# Patient Record
Sex: Female | Born: 1980 | Race: Black or African American | Hispanic: No | Marital: Single | Smoking: Current some day smoker
Health system: Southern US, Community
[De-identification: ages and names within clinical notes are randomized; demographics above are authoritative.]

## PROBLEM LIST (undated history)

## (undated) HISTORY — PX: NOSE SURGERY: SHX723

---

## 2002-10-18 ENCOUNTER — Emergency Department (HOSPITAL_COMMUNITY): Admission: EM | Admit: 2002-10-18 | Discharge: 2002-10-18 | Payer: Self-pay | Admitting: Emergency Medicine

## 2003-02-20 ENCOUNTER — Ambulatory Visit (HOSPITAL_COMMUNITY): Admission: RE | Admit: 2003-02-20 | Discharge: 2003-02-20 | Payer: Self-pay | Admitting: Family Medicine

## 2003-02-20 ENCOUNTER — Encounter: Payer: Self-pay | Admitting: Family Medicine

## 2004-07-18 ENCOUNTER — Emergency Department (HOSPITAL_COMMUNITY): Admission: AC | Admit: 2004-07-18 | Discharge: 2004-07-18 | Payer: Self-pay

## 2005-06-17 ENCOUNTER — Inpatient Hospital Stay (HOSPITAL_COMMUNITY): Admission: AD | Admit: 2005-06-17 | Discharge: 2005-06-20 | Payer: Self-pay | Admitting: Obstetrics and Gynecology

## 2010-09-15 ENCOUNTER — Encounter: Payer: Self-pay | Admitting: Obstetrics and Gynecology

## 2017-05-27 ENCOUNTER — Ambulatory Visit (INDEPENDENT_AMBULATORY_CARE_PROVIDER_SITE_OTHER): Payer: No Typology Code available for payment source

## 2017-05-27 ENCOUNTER — Ambulatory Visit
Admission: EM | Admit: 2017-05-27 | Discharge: 2017-05-27 | Disposition: A | Discharge status: Home / Self Care | Payer: No Typology Code available for payment source | Attending: Family Medicine | Admitting: Family Medicine

## 2017-05-27 ENCOUNTER — Encounter: Payer: Self-pay | Admitting: *Deleted

## 2017-05-27 DIAGNOSIS — M5431 Sciatica, right side: Secondary | ICD-10-CM

## 2017-05-27 LAB — URINALYSIS, COMPLETE (UACMP) WITH MICROSCOPIC
Bacteria, UA: NONE SEEN
Bilirubin Urine: NEGATIVE
Glucose, UA: NEGATIVE mg/dL
Hgb urine dipstick: NEGATIVE
Ketones, ur: NEGATIVE mg/dL
Leukocytes, UA: NEGATIVE
Nitrite: NEGATIVE
Protein, ur: NEGATIVE mg/dL
Specific Gravity, Urine: 1.01 (ref 1.005–1.030)
pH: 5.5 (ref 5.0–8.0)

## 2017-05-27 LAB — COMPREHENSIVE METABOLIC PANEL
ALT: 16 U/L (ref 14–54)
AST: 22 U/L (ref 15–41)
Albumin: 4.6 g/dL (ref 3.5–5.0)
Alkaline Phosphatase: 56 U/L (ref 38–126)
Anion gap: 11 (ref 5–15)
BUN: 11 mg/dL (ref 6–20)
CO2: 22 mmol/L (ref 22–32)
Calcium: 9.3 mg/dL (ref 8.9–10.3)
Chloride: 102 mmol/L (ref 101–111)
Creatinine, Ser: 0.66 mg/dL (ref 0.44–1.00)
GFR calc Af Amer: >60 mL/min (ref >60)
GFR calc non Af Amer: >60 mL/min (ref >60)
Glucose, Bld: 85 mg/dL (ref 65–99)
Potassium: 3.7 mmol/L (ref 3.5–5.1)
Sodium: 135 mmol/L (ref 135–145)
Total Bilirubin: 0.4 mg/dL (ref 0.3–1.2)
Total Protein: 8.5 g/dL — ABNORMAL HIGH (ref 6.5–8.1)

## 2017-05-27 LAB — CBC WITH DIFFERENTIAL/PLATELET
Basophils Absolute: 0.1 10*3/uL (ref 0–0.1)
Basophils Relative: 1 %
Eosinophils Absolute: 0 10*3/uL (ref 0–0.7)
Eosinophils Relative: 1 %
HCT: 38.4 % (ref 35.0–47.0)
Hemoglobin: 13.4 g/dL (ref 12.0–16.0)
Lymphocytes Relative: 40 %
Lymphs Abs: 3.1 10*3/uL (ref 1.0–3.6)
MCH: 33.6 pg (ref 26.0–34.0)
MCHC: 34.9 g/dL (ref 32.0–36.0)
MCV: 96.1 fL (ref 80.0–100.0)
Monocytes Absolute: 0.6 10*3/uL (ref 0.2–0.9)
Monocytes Relative: 7 %
Neutro Abs: 3.9 10*3/uL (ref 1.4–6.5)
Neutrophils Relative %: 51 %
Platelets: 285 10*3/uL (ref 150–440)
RBC: 3.99 MIL/uL (ref 3.80–5.20)
RDW: 13 % (ref 11.5–14.5)
WBC: 7.7 10*3/uL (ref 3.6–11.0)

## 2017-05-27 LAB — PREGNANCY, URINE: Preg Test, Ur: NEGATIVE

## 2017-05-27 MED ORDER — KETOROLAC TROMETHAMINE 10 MG PO TABS: 10.0000 mg | ORAL_TABLET | Freq: Three times a day (TID) | ORAL | 0 refills | Status: DC | PRN

## 2017-05-27 MED ORDER — ONDANSETRON 8 MG PO TBDP
8.0000 mg | ORAL_TABLET | Freq: Once | ORAL | Status: AC
  Administered 2017-05-27: 8 mg via ORAL

## 2017-05-27 MED ORDER — CYCLOBENZAPRINE HCL 10 MG PO TABS: 10.0000 mg | ORAL_TABLET | Freq: Three times a day (TID) | ORAL | 0 refills | Status: DC | PRN

## 2017-05-27 MED ORDER — KETOROLAC TROMETHAMINE 60 MG/2ML IM SOLN
60.0000 mg | Freq: Once | INTRAMUSCULAR | Status: AC
  Administered 2017-05-27: 60 mg via INTRAMUSCULAR

## 2017-05-27 MED ORDER — ONDANSETRON 8 MG PO TBDP: 8.0000 mg | ORAL_TABLET | Freq: Three times a day (TID) | ORAL | 0 refills | Status: DC | PRN

## 2017-05-27 NOTE — ED Triage Notes (Signed)
Pt started having  Lower back pain two weeks ago. She is now having pain to right leg and left side abd.

## 2017-05-27 NOTE — ED Provider Notes (Signed)
[facility]    CSN: [id number] Arrival date & time: 05/27/17  4098     History   Chief Complaint Chief Complaint  Patient presents with  . Back Pain    HPI Shelly Jones is a 36 y.o. female.    Back Pain  Location:  Lumbar spine Quality:  Aching Radiates to:  R posterior upper leg Pain severity:  Moderate Pain is:  Same all the time Onset quality:  Sudden Duration:  2 weeks Timing:  Constant Progression:  Unchanged Chronicity:  New Context: not emotional stress, not falling, not jumping from heights, not lifting heavy objects, not MCA, not MVA, not occupational injury, not pedestrian accident, not physical stress, not recent illness, not recent injury and not twisting   Relieved by:  Nothing Worsened by:  Twisting and movement Ineffective treatments:  OTC medications Associated symptoms: abdominal pain (left flank)   Associated symptoms: no abdominal swelling, no bladder incontinence, no bowel incontinence, no chest pain, no dysuria, no fever, no headaches, no leg pain, no numbness, no paresthesias, no pelvic pain, no perianal numbness, no tingling, no weakness and no weight loss   Risk factors: no hx of cancer, no hx of osteoporosis, no menopause, no recent surgery, no steroid use and no vascular disease     History reviewed. No pertinent past medical history.  There are no active problems to display for this patient.   Past Surgical History:  Procedure Laterality Date  . NOSE SURGERY      OB History    Gravida Para Term Preterm AB Living   1             SAB TAB Ectopic Multiple Live Births                   Home Medications    Prior to Admission medications   Medication Sig Start Date End Date Taking? Authorizing Provider  cyclobenzaprine (FLEXERIL) 10 MG tablet Take 1 tablet (10 mg total) by mouth 3 (three) times daily as needed for muscle spasms. 05/27/17   Payton Mccallum, MD  ketorolac (TORADOL) 10 MG tablet Take 1 tablet (10 mg total) by  mouth every 8 (eight) hours as needed. 05/27/17   Payton Mccallum, MD  ondansetron (ZOFRAN ODT) 8 MG disintegrating tablet Take 1 tablet (8 mg total) by mouth every 8 (eight) hours as needed. 05/27/17   Payton Mccallum, MD    Family History History reviewed. No pertinent family history.  Social History Social History  Substance Use Topics  . Smoking status: Current Some Day Smoker    Packs/day: 0.50    Years: 5.00  . Smokeless tobacco: Never Used  . Alcohol use Yes     Comment: occasionally      Allergies   Kiwi extract; Percocet [oxycodone-acetaminophen]; Chocolate; Penicillins; and Pineapple   Review of Systems Review of Systems  Constitutional: Negative for fever and weight loss.  Cardiovascular: Negative for chest pain.  Gastrointestinal: Positive for abdominal pain (left flank). Negative for bowel incontinence.  Genitourinary: Negative for bladder incontinence, dysuria and pelvic pain.  Musculoskeletal: Positive for back pain.  Neurological: Negative for tingling, weakness, numbness, headaches and paresthesias.     Physical Exam Triage Vital Signs ED Triage Vitals  Enc Vitals Group     BP 05/27/17 1000 (!) 131/93     Pulse Rate 05/27/17 1000 97     Resp 05/27/17 1000 12     Temp 05/27/17 1000 98.5 F (36.9 C)  Temp Source 05/27/17 1000 Oral     SpO2 05/27/17 1000 100 %     Weight 05/27/17 0952 182 lb (82.6 kg)     Height 05/27/17 0952  (1.626 m)     Head Circumference --      Peak Flow --      Pain Score 05/27/17 0953 7     Pain Loc --      Pain Edu? --      Excl. in GC? --    No data found.   Updated Vital Signs BP (!) 131/93 (BP Location: Left Arm)   Pulse 97   Temp 98.5 F (36.9 C) (Oral)   Resp 12   Ht  (1.626 m)   Wt 182 lb (82.6 kg)   LMP 05/26/2017 (Exact Date) Comment: denies preg and signed waiver  SpO2 100%   BMI 31.24 kg/m   Visual Acuity Right Eye Distance:   Left Eye Distance:   Bilateral Distance:    Right Eye  Near:   Left Eye Near:    Bilateral Near:     Physical Exam  Constitutional: She appears well-developed and well-nourished. No distress.  Cardiovascular: Normal rate and regular rhythm.   Pulmonary/Chest: Effort normal and breath sounds normal.  Abdominal: Soft. Bowel sounds are normal. She exhibits no distension and no mass. There is no tenderness. There is no rebound and no guarding.  Musculoskeletal:       Lumbar back: She exhibits tenderness and spasm. She exhibits normal range of motion, no bony tenderness, no swelling, no edema, no deformity, no laceration, no pain and normal pulse.  Skin: She is not diaphoretic.  Nursing note and vitals reviewed.    UC Treatments / Results  Labs (all labs ordered are listed, but only abnormal results are displayed) Labs Reviewed  COMPREHENSIVE METABOLIC PANEL - Abnormal; Notable for the following:       Result Value   Total Protein 8.5 (*)    All other components within normal limits  URINALYSIS, COMPLETE (UACMP) WITH MICROSCOPIC - Abnormal; Notable for the following:    Color, Urine STRAW (*)    Squamous Epithelial / LPF 0-5 (*)    All other components within normal limits  CBC WITH DIFFERENTIAL/PLATELET  PREGNANCY, URINE    EKG  EKG Interpretation None       Radiology Dg Abd 2 Views  Result Date: 05/27/2017 CLINICAL DATA:  Abdominal pain for the past 2 weeks. Patient reports walking without a limp. Some loose stools. History of kidney stones. EXAM: ABDOMEN - 2 VIEW COMPARISON:  None in PACs FINDINGS: There is a moderate amount of gas and stool within the colon. The stool burden is not excessive however. Small amounts of small bowel gas are present without evidence of abnormal distention. There is no free extraluminal gas. There is gas in the rectum. An IUD is present in the pelvis. No calcified urinary tract stones are observed. The bony structures are unremarkable. IMPRESSION: No definite acute intra-abdominal abnormality. If  enteritis or colitis are suspected clinically, abdominal and pelvic CT scanning would be a useful next imaging step. Colitis is suspected clinically, No calcified urinary tract stones are observed. Electronically Signed   By: David  [place] M.D.   On: 05/27/2017 10:54    Procedures Procedures (including critical care time)  Medications Ordered in UC Medications  ketorolac (TORADOL) injection 60 mg (60 mg Intramuscular Given 05/27/17 1025)  ondansetron (ZOFRAN-ODT) disintegrating tablet 8 mg (8 mg Oral Given 05/27/17  1025)     Initial Impression / Assessment and Plan / UC Course  I have reviewed the triage vital signs and the nursing notes.  Pertinent labs & imaging results that were available during my care of the patient were reviewed by me and considered in my medical decision making (see chart for details).       Final Clinical Impressions(s) / UC Diagnoses   Final diagnoses:  Sciatica, right side    New Prescriptions Discharge Medication List as of 05/27/2017 11:50 AM    START taking these medications   Details  cyclobenzaprine (FLEXERIL) 10 MG tablet Take 1 tablet (10 mg total) by mouth 3 (three) times daily as needed for muscle spasms., Starting Wed 05/27/2017, Normal    ketorolac (TORADOL) 10 MG tablet Take 1 tablet (10 mg total) by mouth every 8 (eight) hours as needed., Starting Wed 05/27/2017, Normal    ondansetron (ZOFRAN ODT) 8 MG disintegrating tablet Take 1 tablet (8 mg total) by mouth every 8 (eight) hours as needed., Starting Wed 05/27/2017, Normal       1. Labs/x-ray results and diagnosis reviewed with patient; patient given toradol  IM x1 and zofran  po x 1 with improvement of symptoms 2. rx as per orders above; reviewed possible side effects, interactions, risks and benefits  3. Recommend supportive treatment with rest, ice, otc analgesics prn 4. Follow-up prn if symptoms worsen or don't improve Controlled Substance Prescriptions [place] Controlled  Substance Registry consulted? No   Payton Mccallum, MD 05/27/17 1218

## 2018-10-08 ENCOUNTER — Emergency Department
Admission: EM | Admit: 2018-10-08 | Discharge: 2018-10-08 | Disposition: A | Discharge status: Home / Self Care | Payer: PRIVATE HEALTH INSURANCE | Attending: Emergency Medicine | Admitting: Emergency Medicine

## 2018-10-08 ENCOUNTER — Emergency Department: Payer: PRIVATE HEALTH INSURANCE

## 2018-10-08 ENCOUNTER — Encounter: Payer: Self-pay | Admitting: Emergency Medicine

## 2018-10-08 DIAGNOSIS — Y92009 Unspecified place in unspecified non-institutional (private) residence as the place of occurrence of the external cause: Secondary | ICD-10-CM | POA: Insufficient documentation

## 2018-10-08 DIAGNOSIS — Y998 Other external cause status: Secondary | ICD-10-CM | POA: Diagnosis not present

## 2018-10-08 DIAGNOSIS — F17210 Nicotine dependence, cigarettes, uncomplicated: Secondary | ICD-10-CM | POA: Insufficient documentation

## 2018-10-08 DIAGNOSIS — S0990XA Unspecified injury of head, initial encounter: Secondary | ICD-10-CM | POA: Diagnosis not present

## 2018-10-08 DIAGNOSIS — S0993XA Unspecified injury of face, initial encounter: Secondary | ICD-10-CM | POA: Insufficient documentation

## 2018-10-08 DIAGNOSIS — Y9389 Activity, other specified: Secondary | ICD-10-CM | POA: Diagnosis not present

## 2018-10-08 MED ORDER — MELOXICAM 15 MG PO TABS: 15.0000 mg | ORAL_TABLET | Freq: Every day | ORAL | 1 refills | Status: AC

## 2018-10-08 MED ORDER — METHOCARBAMOL 500 MG PO TABS: 500.0000 mg | ORAL_TABLET | Freq: Three times a day (TID) | ORAL | 0 refills | Status: AC | PRN
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
## 2020-11-20 ENCOUNTER — Ambulatory Visit
Admission: EM | Admit: 2020-11-20 | Discharge: 2020-11-20 | Disposition: A | Discharge status: Home / Self Care | Payer: PRIVATE HEALTH INSURANCE | Attending: Family Medicine | Admitting: Family Medicine

## 2020-11-20 ENCOUNTER — Ambulatory Visit
Admission: RE | Admit: 2020-11-20 | Discharge: 2020-11-20 | Disposition: A | Discharge status: Home / Self Care | Payer: No Typology Code available for payment source | Source: Ambulatory Visit | Attending: Family Medicine | Admitting: Family Medicine

## 2020-11-20 ENCOUNTER — Other Ambulatory Visit: Payer: Self-pay

## 2020-11-20 DIAGNOSIS — M7989 Other specified soft tissue disorders: Secondary | ICD-10-CM

## 2020-11-20 DIAGNOSIS — M79661 Pain in right lower leg: Secondary | ICD-10-CM | POA: Diagnosis not present

## 2020-11-20 NOTE — Discharge Instructions (Addendum)
Sending you for ultrasound of the leg.  I will call you when I get the results.

## 2020-11-20 NOTE — ED Triage Notes (Signed)
Pt reports RLE swelling and painful x 1.5 weeks.  Reports R calf cramping. No warmth or redness noted.  No SOB.  No FMH of clots. No recent travel.    States that approx 3 weeks ago did have severe lumbar/sciatica pain radiating BLE with numbness in bilateral feet.  Was eval'ed at [facility] and had xray/UA but never found anything.  Took Prednisone and it resolved after about one week. Was sedentary during this time period.

## 2020-11-21 NOTE — ED Provider Notes (Signed)
[name]    CSN: [id number] Arrival date & time: 11/20/20  1426      History   Chief Complaint Chief Complaint  Patient presents with  . Leg Pain    HPI Shelly Jones is a 40 y.o. female.   Pt is a 40 year old female that presents with RLE pain, swelling. This has been x 1.5 weeks. Reports right calf cramping. No warmth or redness noted.  No chest pain,  SOB.  No PMH of DVT or PE. No recent travel.  States that approx 3 weeks ago did have severe lumbar/sciatica pain radiating BLE with numbness in bilateral feet, mild swelling Finished course or prednisone and it resolved after about one week. Was sedentary during this time period.    Leg Pain   No past medical history on file.  There are no problems to display for this patient.   Past Surgical History:  Procedure Laterality Date  . NOSE SURGERY      OB History    Gravida  1   Para      Term      Preterm      AB      Living        SAB      IAB      Ectopic      Multiple      Live Births               Home Medications    Prior to Admission medications   Not on File    Family History No family history on file.  Social History Social History   Tobacco Use  . Smoking status: Current Some Day Smoker    Packs/day: 0.50    Years: 5.00    Pack years: 2.50  . Smokeless tobacco: Never Used  Substance Use Topics  . Alcohol use: Yes    Comment: occasionally   . Drug use: No     Allergies   Kiwi extract, Percocet [oxycodone-acetaminophen], Chocolate, Penicillins, and Pineapple   Review of Systems Review of Systems   Physical Exam Triage Vital Signs ED Triage Vitals  Enc Vitals Group     BP 11/20/20 1438 (!) 138/93     Pulse Rate 11/20/20 1438 (!) 108     Resp 11/20/20 1438 14     Temp 11/20/20 1438 98.8 F (37.1 C)     Temp Source 11/20/20 1438 Oral     SpO2 11/20/20 1438 93 %     Weight --      Height --      Head Circumference --      Peak Flow --       Pain Score 11/20/20 1452 6     Pain Loc --      Pain Edu? --      Excl. in [place]? --    No data found.  Updated Vital Signs BP (!) 138/93 (BP Location: Right Arm)   Pulse (!) 108   Temp 98.8 F (37.1 C) (Oral)   Resp 14   SpO2 93%   Visual Acuity Right Eye Distance:   Left Eye Distance:   Bilateral Distance:    Right Eye Near:   Left Eye Near:    Bilateral Near:     Physical Exam Vitals and nursing note reviewed.  Constitutional:      General: She is not in acute distress.    Appearance: Normal appearance. She is not ill-appearing, toxic-appearing or  diaphoretic.  HENT:     Head: Normocephalic.  Eyes:     Conjunctiva/sclera: Conjunctivae normal.  Cardiovascular:     Rate and Rhythm: Normal rate and regular rhythm.  Pulmonary:     Effort: Pulmonary effort is normal.     Breath sounds: Normal breath sounds.  Musculoskeletal:        General: Normal range of motion.     Cervical back: Normal range of motion.     Right lower leg: Edema present.     Left lower leg: Edema present.     Comments: BLE edema worse on the right. Non pitting. No erythema Pain in calf with palpation along with pain in lateral thigh area.  2+ pedal pulse. No foot swelling   Skin:    General: Skin is warm and dry.     Findings: No rash.  Neurological:     Mental Status: She is alert.  Psychiatric:        Mood and Affect: Mood normal.      UC Treatments / Results  Labs (all labs ordered are listed, but only abnormal results are displayed) Labs Reviewed - No data to display  EKG   Radiology US Venous Img Lower Unilateral Right (DVT)  Result Date: 11/20/2020 CLINICAL DATA:  Right lower extremity swelling. EXAM: RIGHT LOWER EXTREMITY VENOUS DOPPLER ULTRASOUND TECHNIQUE: Gray-scale sonography with compression, as well as color and duplex ultrasound, were performed to evaluate the deep venous system(s) from the level of the common femoral vein through the popliteal and proximal calf veins.  COMPARISON:  None. FINDINGS: VENOUS Normal compressibility of the common femoral, superficial femoral, and popliteal veins, as well as the visualized calf veins. Visualized portions of profunda femoral vein and great saphenous vein unremarkable. No filling defects to suggest DVT on grayscale or color Doppler imaging. Doppler waveforms show normal direction of venous flow, normal respiratory plasticity and response to augmentation. Limited views of the contralateral common femoral vein are unremarkable. IMPRESSION: No evidence of DVT. Electronically Signed   By: Feliberto Harts MD   On: 11/20/2020 17:17    Procedures Procedures (including critical care time)  Medications Ordered in UC Medications - No data to display  Initial Impression / Assessment and Plan / UC Course  I have reviewed the triage vital signs and the nursing notes.  Pertinent labs & imaging results that were available during my care of the patient were reviewed by me and considered in my medical decision making (see chart for details).     LE swelling, pain Concern for PE. Sending for [place] to r/o No chest pain, SOB or concern for PE.  Patient is mildly tachycardic but reporting history of similar for a while. Will discuss treatment plan after results Final Clinical Impressions(s) / UC Diagnoses   Final diagnoses:  Pain and swelling of right lower leg     Discharge Instructions     Sending you for ultrasound of the leg.  I will call you when I get the results.     ED Prescriptions    None     PDMP not reviewed this encounter.   Janace Aris, NP 11/21/20 [phone]

## 2021-09-13 ENCOUNTER — Other Ambulatory Visit: Payer: Self-pay

## 2021-09-13 ENCOUNTER — Encounter: Payer: Self-pay | Admitting: Emergency Medicine

## 2021-09-13 ENCOUNTER — Ambulatory Visit
Admission: EM | Admit: 2021-09-13 | Discharge: 2021-09-13 | Disposition: A | Discharge status: Home / Self Care | Payer: No Typology Code available for payment source | Attending: Internal Medicine | Admitting: Internal Medicine

## 2021-09-13 DIAGNOSIS — R5383 Other fatigue: Secondary | ICD-10-CM | POA: Diagnosis not present

## 2021-09-13 DIAGNOSIS — J069 Acute upper respiratory infection, unspecified: Secondary | ICD-10-CM

## 2021-09-13 NOTE — ED Triage Notes (Signed)
Pt here with 3 days of cough, body aches, low grade fevers, sore throat, burning in head, hair loss in the area of burning and headache. Pt tested negative for flu and covid at work and needs to be taken out of work by a provider.

## 2021-09-13 NOTE — ED Provider Notes (Signed)
[facility]    CSN: [id number] Arrival date & time: 09/13/21  1057      History   Chief Complaint Chief Complaint  Patient presents with   Cough   Sore Throat   Nasal Congestion   Headache   Alopecia    HPI Shelly Jones is a 41 y.o. female comes to urgent care with 3 to 4-day history of nonproductive cough, generalized body aches, fever of 100 F and sore throat.  Patient's symptoms started with a cough and generalized body aches 4 days ago.  Subsequently patient started experiencing sore throat.  Cough is not productive of sputum.  She denies any chest tightness or wheezing.  No sick contacts.  Patient tested negative for COVID-19 and flu a couple of days ago.  No nausea, vomiting or diarrhea.  Patient is complaining of some cold intolerance, hair loss and fatigue.  Patient endorses that she has been depressed for a long time but denies any suicidal or homicidal ideation.  No changes in weight.  HPI  History reviewed. No pertinent past medical history.  There are no problems to display for this patient.   Past Surgical History:  Procedure Laterality Date   NOSE SURGERY      OB History     Gravida  1   Para      Term      Preterm      AB      Living         SAB      IAB      Ectopic      Multiple      Live Births               Home Medications    Prior to Admission medications   Not on File    Family History History reviewed. No pertinent family history.  Social History Social History   Tobacco Use   Smoking status: Some Days    Packs/day: 0.50    Years: 5.00    Pack years: 2.50    Types: Cigarettes   Smokeless tobacco: Never  Substance Use Topics   Alcohol use: Yes    Comment: occasionally    Drug use: No     Allergies   Kiwi extract, Percocet [oxycodone-acetaminophen], Chocolate, Penicillins, and Pineapple   Review of Systems Review of Systems As per HPI  Physical Exam Triage Vital Signs ED Triage  Vitals  Enc Vitals Group     BP 09/13/21 1110 (!) 142/97     Pulse Rate 09/13/21 1110 (!) 104     Resp 09/13/21 1110 18     Temp 09/13/21 1110 100 F (37.8 C)     Temp Source 09/13/21 1110 Oral     SpO2 09/13/21 1110 96 %     Weight --      Height --      Head Circumference --      Peak Flow --      Pain Score 09/13/21 1118 5     Pain Loc --      Pain Edu? --      Excl. in [place]? --    No data found.  Updated Vital Signs BP (!) 142/97 (BP Location: Left Arm)    Pulse (!) 104    Temp 100 F (37.8 C) (Oral)    Resp 18    SpO2 96%   Visual Acuity Right Eye Distance:   Left Eye Distance:   Bilateral  Distance:    Right Eye Near:   Left Eye Near:    Bilateral Near:     Physical Exam Vitals and nursing note reviewed.  Constitutional:      General: She is not in acute distress.    Appearance: She is not ill-appearing.  HENT:     Right Ear: Tympanic membrane normal.     Left Ear: Tympanic membrane normal.     Mouth/Throat:     Pharynx: Uvula midline. No oropharyngeal exudate or posterior oropharyngeal erythema.     Tonsils: No tonsillar abscesses.  Eyes:     Extraocular Movements:     Right eye: Normal extraocular motion.     Left eye: Normal extraocular motion.  Cardiovascular:     Rate and Rhythm: Normal rate and regular rhythm.  Pulmonary:     Effort: Pulmonary effort is normal.     Breath sounds: Normal breath sounds.  Abdominal:     General: Bowel sounds are normal.     Palpations: Abdomen is soft.  Skin:    General: Skin is warm.     Findings: No rash.  Neurological:     Mental Status: She is alert.     UC Treatments / Results  Labs (all labs ordered are listed, but only abnormal results are displayed) Labs Reviewed  CBC WITH DIFFERENTIAL/PLATELET  COMPREHENSIVE METABOLIC PANEL  TSH  VITAMIN D 25 HYDROXY (VIT D DEFICIENCY, FRACTURES)    EKG   Radiology No results found.  Procedures Procedures (including critical care time)  Medications  Ordered in UC Medications - No data to display  Initial Impression / Assessment and Plan / UC Course  I have reviewed the triage vital signs and the nursing notes.  Pertinent labs & imaging results that were available during my care of the patient were reviewed by me and considered in my medical decision making (see chart for details).     1.  Acute viral illness: Supportive care with increased oral fluid intake Tylenol as needed for fever and/or body aches CBC If symptoms worsen patient is advised to return to urgent care for further evaluation  2.  Depression: Vitamin D, TSH We will call patient with recommendations We have set patient up with a primary care physician. Final Clinical Impressions(s) / UC Diagnoses   Final diagnoses:  Fatigue, unspecified type  Viral upper respiratory illness     Discharge Instructions      Obtain adequate hydration We will call you with recommendations if labs are abnormal We will set you up with a primary care physician. Return to urgent care if you have any further concerns.    ED Prescriptions   None    PDMP not reviewed this encounter.   Chase Picket, MD 09/13/21 1225

## 2021-09-13 NOTE — Discharge Instructions (Signed)
Obtain adequate hydration We will call you with recommendations if labs are abnormal We will set you up with a primary care physician. Return to urgent care if you have any further concerns.

## 2021-09-14 LAB — CBC WITH DIFFERENTIAL/PLATELET
Basophils Absolute: 0 10*3/uL (ref 0.0–0.2)
Basos: 0 %
EOS (ABSOLUTE): 0.1 10*3/uL (ref 0.0–0.4)
Eos: 1 %
Hematocrit: 40.3 % (ref 34.0–46.6)
Hemoglobin: 14.1 g/dL (ref 11.1–15.9)
Immature Grans (Abs): 0 10*3/uL (ref 0.0–0.1)
Immature Granulocytes: 0 %
Lymphocytes Absolute: 1.3 10*3/uL (ref 0.7–3.1)
Lymphs: 26 %
MCH: 32.9 pg (ref 26.6–33.0)
MCHC: 35 g/dL (ref 31.5–35.7)
MCV: 94 fL (ref 79–97)
Monocytes Absolute: 0.5 10*3/uL (ref 0.1–0.9)
Monocytes: 10 %
Neutrophils Absolute: 3.3 10*3/uL (ref 1.4–7.0)
Neutrophils: 63 %
Platelets: 224 10*3/uL (ref 150–450)
RBC: 4.29 x10E6/uL (ref 3.77–5.28)
RDW: 13.2 % (ref 11.7–15.4)
WBC: 5.2 10*3/uL (ref 3.4–10.8)

## 2021-09-14 LAB — COMPREHENSIVE METABOLIC PANEL
ALT: 24 IU/L (ref 0–32)
AST: 26 IU/L (ref 0–40)
Albumin/Globulin Ratio: 1.6 (ref 1.2–2.2)
Albumin: 4.5 g/dL (ref 3.8–4.8)
Alkaline Phosphatase: 65 IU/L (ref 44–121)
BUN/Creatinine Ratio: 9 (ref 9–23)
BUN: 6 mg/dL (ref 6–20)
Bilirubin Total: <0.2 mg/dL (ref 0.0–1.2)
CO2: 17 mmol/L — ABNORMAL LOW (ref 20–29)
Calcium: 9.1 mg/dL (ref 8.7–10.2)
Chloride: 101 mmol/L (ref 96–106)
Creatinine, Ser: 0.64 mg/dL (ref 0.57–1.00)
Globulin, Total: 2.9 g/dL (ref 1.5–4.5)
Sodium: 138 mmol/L (ref 134–144)
Total Protein: 7.4 g/dL (ref 6.0–8.5)
eGFR: 115 mL/min/{1.73_m2} (ref >59)

## 2021-09-14 LAB — VITAMIN D 25 HYDROXY (VIT D DEFICIENCY, FRACTURES): Vit D, 25-Hydroxy: 22.7 ng/mL — ABNORMAL LOW (ref 30.0–100.0)

## 2021-09-14 LAB — TSH: TSH: 1.77 u[IU]/mL (ref 0.450–4.500)

## 2021-11-01 ENCOUNTER — Other Ambulatory Visit: Payer: Self-pay

## 2021-11-01 ENCOUNTER — Ambulatory Visit (INDEPENDENT_AMBULATORY_CARE_PROVIDER_SITE_OTHER): Payer: PRIVATE HEALTH INSURANCE | Admitting: Family Medicine

## 2021-11-01 ENCOUNTER — Encounter: Payer: Self-pay | Admitting: Family Medicine

## 2021-11-01 VITALS — BP 139/94 | HR 92 | Temp 98.0°F | Resp 16 | Ht 59.5 in | Wt 197.0 lb

## 2021-11-01 DIAGNOSIS — Z7689 Persons encountering health services in other specified circumstances: Secondary | ICD-10-CM

## 2021-11-01 DIAGNOSIS — Z6839 Body mass index (BMI) 39.0-39.9, adult: Secondary | ICD-10-CM

## 2021-11-01 DIAGNOSIS — I10 Essential (primary) hypertension: Secondary | ICD-10-CM

## 2021-11-01 DIAGNOSIS — M5441 Lumbago with sciatica, right side: Secondary | ICD-10-CM

## 2021-11-01 DIAGNOSIS — F3289 Other specified depressive episodes: Secondary | ICD-10-CM

## 2021-11-01 DIAGNOSIS — E66812 Obesity, class 2: Secondary | ICD-10-CM

## 2021-11-01 DIAGNOSIS — L639 Alopecia areata, unspecified: Secondary | ICD-10-CM | POA: Diagnosis not present

## 2021-11-01 MED ORDER — FLUOXETINE HCL 40 MG PO CAPS: 40.0000 mg | ORAL_CAPSULE | Freq: Every day | ORAL | 3 refills | Status: DC

## 2021-11-01 MED ORDER — CELECOXIB 200 MG PO CAPS: 200.0000 mg | ORAL_CAPSULE | Freq: Every day | ORAL | 1 refills | Status: DC

## 2021-11-01 MED ORDER — MINOXIDIL FOR WOMEN 2 % EX SOLN: Freq: Two times a day (BID) | CUTANEOUS | 0 refills | Status: DC

## 2021-11-01 MED ORDER — LOSARTAN POTASSIUM 50 MG PO TABS: 50.0000 mg | ORAL_TABLET | Freq: Every day | ORAL | 1 refills | Status: DC

## 2021-11-01 NOTE — Progress Notes (Signed)
Patient is here to est care with pcp. Patient c/o depression. Patient also c/o having headaches and hypertension running in here family. Patient also said her hair has been falling out and she is concern ? ?

## 2021-11-04 ENCOUNTER — Encounter: Payer: Self-pay | Admitting: Family Medicine

## 2021-11-04 NOTE — Progress Notes (Signed)
? ?New Patient Office Visit ? ?Subjective:  ?Patient ID: Shelly Jones, female    DOB: January 26, 1981  Age: 41 y.o. MRN: [id number] ? ?CC:  ?Chief Complaint  ?Patient presents with  ? Establish Care  ? ? ?HPI ?Shelly Jones presents for to establish care. Patient also is having complaints of depression that she has battled for years, hypertension, chronic back pain with sciatica, and losing her hair.  ? ?History reviewed. No pertinent past medical history. ? ?Past Surgical History:  ?Procedure Laterality Date  ? NOSE SURGERY    ? ? ?Family History  ?Problem Relation Age of Onset  ? Hypertension Mother   ? ? ?Social History  ? ?Socioeconomic History  ? Marital status: Single  ?  Spouse name: Not on file  ? Number of children: Not on file  ? Years of education: Not on file  ? Highest education level: Not on file  ?Occupational History  ? Not on file  ?Tobacco Use  ? Smoking status: Some Days  ?  Packs/day: 0.50  ?  Years: 5.00  ?  Pack years: 2.50  ?  Types: Cigarettes  ? Smokeless tobacco: Never  ?Substance and Sexual Activity  ? Alcohol use: Yes  ?  Comment: occasionally   ? Drug use: No  ? Sexual activity: Not on file  ?Other Topics Concern  ? Not on file  ?Social History Narrative  ? Not on file  ? ?Social Determinants of Health  ? ?Financial Resource Strain: Not on file  ?Food Insecurity: Not on file  ?Transportation Needs: Not on file  ?Physical Activity: Not on file  ?Stress: Not on file  ?Social Connections: Not on file  ?Intimate Partner Violence: Not on file  ? ? ?ROS ?Review of Systems  ?Cardiovascular: Negative.   ?Musculoskeletal:  Positive for back pain.  ?Psychiatric/Behavioral:  Positive for sleep disturbance. Negative for self-injury and suicidal ideas. The patient is nervous/anxious.   ?All other systems reviewed and are negative. ? ?Objective:  ? ?Today's Vitals: BP (!) 139/94   Pulse 92   Temp 98 ?F (36.7 ?C) (Oral)   Resp 16   Ht 4' 11.5" (1.511 m)   Wt 197 lb (89.4 kg)   SpO2 94%   BMI 39.12  kg/m?  ? ?Physical Exam ?Vitals and nursing note reviewed.  ?Constitutional:   ?   General: She is not in acute distress. ?Cardiovascular:  ?   Rate and Rhythm: Normal rate and regular rhythm.  ?Pulmonary:  ?   Effort: Pulmonary effort is normal.  ?   Breath sounds: Normal breath sounds.  ?Abdominal:  ?   Palpations: Abdomen is soft.  ?   Tenderness: There is no abdominal tenderness.  ?Musculoskeletal:  ?   Right lower leg: No edema.  ?   Left lower leg: No edema.  ?Neurological:  ?   General: No focal deficit present.  ?   Mental Status: She is alert and oriented to person, place, and time.  ?Psychiatric:     ?   Mood and Affect: Mood is depressed. Affect is flat.     ?   Speech: Speech normal.     ?   Behavior: Behavior is slowed. Behavior is cooperative.  ? ? ?Assessment & Plan:  ? ?1. Other depression ?Prozac 40 mg prescribed as patient reports that this has been effective in the past. monitor ? ?2. Essential hypertension ?Elevated reading. Patient has not been on meds previously. Losartan 50 mg prescribed. monitor ? ?3.  Right-sided low back pain with right-sided sciatica, unspecified chronicity ?Celebrex prescribed as this has been effective in the past ? ?4. Alopecia areata ?? 2/2 mood disorder - minoxidil prescribed. monitor ? ?5. Class 2 severe obesity due to excess calories with serious comorbidity and body mass index (BMI) of 39.0 to 39.9 in adult [facility]) ?Discussed dietary and activity options. Goal is 3-5lbs/mo wt loss.  ? ?6. Encounter to establish care ? ? ?Outpatient Encounter Medications as of 11/01/2021  ?Medication Sig  ? Ascorbic Acid (VITAMIN C) 100 MG CHEW   ? celecoxib (CELEBREX) 200 MG capsule Take 1 capsule (200 mg total) by mouth daily.  ? FLUoxetine (PROZAC) 40 MG capsule Take 1 capsule (40 mg total) by mouth daily.  ? losartan (COZAAR) 50 MG tablet Take 1 tablet (50 mg total) by mouth daily.  ? minoxidil (MINOXIDIL FOR WOMEN) 2 % external solution Apply topically 2 (two) times daily.  ?  vitamin B-12 (CYANOCOBALAMIN) 100 MCG tablet   ? Zinc 10 MG LOZG   ? ?No facility-administered encounter medications on file as of 11/01/2021.  ? ? ?Follow-up: Return in about 4 weeks (around 11/29/2021) for physical.  ? ?Tommie Raymond, MD ? ?

## 2021-11-19 ENCOUNTER — Encounter: Payer: Self-pay | Admitting: Family Medicine

## 2021-11-19 ENCOUNTER — Ambulatory Visit (INDEPENDENT_AMBULATORY_CARE_PROVIDER_SITE_OTHER): Payer: PRIVATE HEALTH INSURANCE | Admitting: Family Medicine

## 2021-11-19 ENCOUNTER — Other Ambulatory Visit: Payer: Self-pay

## 2021-11-19 VITALS — BP 127/86 | HR 109 | Temp 98.8°F | Resp 16 | Ht 64.5 in | Wt 197.4 lb

## 2021-11-19 DIAGNOSIS — Z130 Encounter for screening for diseases of the blood and blood-forming organs and certain disorders involving the immune mechanism: Secondary | ICD-10-CM

## 2021-11-19 DIAGNOSIS — Z114 Encounter for screening for human immunodeficiency virus [HIV]: Secondary | ICD-10-CM

## 2021-11-19 DIAGNOSIS — Z131 Encounter for screening for diabetes mellitus: Secondary | ICD-10-CM

## 2021-11-19 DIAGNOSIS — Z13220 Encounter for screening for lipoid disorders: Secondary | ICD-10-CM

## 2021-11-19 DIAGNOSIS — Z0000 Encounter for general adult medical examination without abnormal findings: Secondary | ICD-10-CM

## 2021-11-19 DIAGNOSIS — Z1159 Encounter for screening for other viral diseases: Secondary | ICD-10-CM

## 2021-11-19 MED ORDER — MINOXIDIL FOR WOMEN 2 % EX SOLN: Freq: Two times a day (BID) | CUTANEOUS | 0 refills | Status: DC

## 2021-11-19 NOTE — Progress Notes (Signed)
Patient is here for CPE. Patient said that she has no new concerns today for the provider. ?

## 2021-11-20 ENCOUNTER — Encounter: Payer: Self-pay | Admitting: Family Medicine

## 2021-11-20 LAB — CBC WITH DIFFERENTIAL/PLATELET
Basophils Absolute: 0 10*3/uL (ref 0.0–0.2)
Basos: 1 %
EOS (ABSOLUTE): 0.1 10*3/uL (ref 0.0–0.4)
Eos: 1 %
Hematocrit: 38.2 % (ref 34.0–46.6)
Hemoglobin: 13.1 g/dL (ref 11.1–15.9)
Immature Grans (Abs): 0 10*3/uL (ref 0.0–0.1)
Immature Granulocytes: 0 %
Lymphocytes Absolute: 2.8 10*3/uL (ref 0.7–3.1)
Lymphs: 43 %
MCH: 32.5 pg (ref 26.6–33.0)
MCHC: 34.3 g/dL (ref 31.5–35.7)
MCV: 95 fL (ref 79–97)
Monocytes Absolute: 0.4 10*3/uL (ref 0.1–0.9)
Monocytes: 7 %
Neutrophils Absolute: 3.1 10*3/uL (ref 1.4–7.0)
Neutrophils: 48 %
Platelets: 294 10*3/uL (ref 150–450)
RBC: 4.03 x10E6/uL (ref 3.77–5.28)
RDW: 13.2 % (ref 11.7–15.4)
WBC: 6.5 10*3/uL (ref 3.4–10.8)

## 2021-11-20 LAB — CMP14+EGFR
ALT: 12 IU/L (ref 0–32)
AST: 19 IU/L (ref 0–40)
Albumin/Globulin Ratio: 2 (ref 1.2–2.2)
Albumin: 4.9 g/dL — ABNORMAL HIGH (ref 3.8–4.8)
Alkaline Phosphatase: 71 IU/L (ref 44–121)
BUN/Creatinine Ratio: 12 (ref 9–23)
BUN: 10 mg/dL (ref 6–20)
Bilirubin Total: 0.3 mg/dL (ref 0.0–1.2)
CO2: 21 mmol/L (ref 20–29)
Calcium: 10 mg/dL (ref 8.7–10.2)
Chloride: 104 mmol/L (ref 96–106)
Creatinine, Ser: 0.85 mg/dL (ref 0.57–1.00)
Globulin, Total: 2.5 g/dL (ref 1.5–4.5)
Glucose: 92 mg/dL (ref 70–99)
Potassium: 4.7 mmol/L (ref 3.5–5.2)
Sodium: 142 mmol/L (ref 134–144)
Total Protein: 7.4 g/dL (ref 6.0–8.5)
eGFR: 89 mL/min/{1.73_m2} (ref >59)

## 2021-11-20 LAB — LIPID PANEL
Chol/HDL Ratio: 1.9 ratio (ref 0.0–4.4)
Cholesterol, Total: 157 mg/dL (ref 100–199)
HDL: 82 mg/dL (ref >39)
LDL Chol Calc (NIH): 62 mg/dL (ref 0–99)
Triglycerides: 68 mg/dL (ref 0–149)
VLDL Cholesterol Cal: 13 mg/dL (ref 5–40)

## 2021-11-20 LAB — HEMOGLOBIN A1C
Est. average glucose Bld gHb Est-mCnc: 105 mg/dL
Hgb A1c MFr Bld: 5.3 % (ref 4.8–5.6)

## 2021-11-20 LAB — HIV ANTIBODY (ROUTINE TESTING W REFLEX): HIV Screen 4th Generation wRfx: NONREACTIVE

## 2021-11-20 LAB — TSH: TSH: 0.904 u[IU]/mL (ref 0.450–4.500)

## 2021-11-20 LAB — HEPATITIS C ANTIBODY: Hep C Virus Ab: NONREACTIVE

## 2021-11-20 NOTE — Progress Notes (Signed)
? ?Established Patient Office Visit ? ?Subjective:  ?Patient ID: Shelly Jones, female    DOB: 1980/09/08  Age: 41 y.o. MRN: [id number] ? ?CC:  ?Chief Complaint  ?Patient presents with  ? Establish Care  ? ? ?HPI ?Len Kluver presents for routine annual exam. Patient denies acute complaints or concerns.  ? ?History reviewed. No pertinent past medical history. ? ?Past Surgical History:  ?Procedure Laterality Date  ? NOSE SURGERY    ? ? ?Family History  ?Problem Relation Age of Onset  ? Hypertension Mother   ? ? ?Social History  ? ?Socioeconomic History  ? Marital status: Single  ?  Spouse name: Not on file  ? Number of children: Not on file  ? Years of education: Not on file  ? Highest education level: Not on file  ?Occupational History  ? Not on file  ?Tobacco Use  ? Smoking status: Some Days  ?  Packs/day: 0.50  ?  Years: 5.00  ?  Pack years: 2.50  ?  Types: Cigarettes  ? Smokeless tobacco: Never  ?Substance and Sexual Activity  ? Alcohol use: Yes  ?  Comment: occasionally   ? Drug use: No  ? Sexual activity: Not on file  ?Other Topics Concern  ? Not on file  ?Social History Narrative  ? Not on file  ? ?Social Determinants of Health  ? ?Financial Resource Strain: Not on file  ?Food Insecurity: Not on file  ?Transportation Needs: Not on file  ?Physical Activity: Not on file  ?Stress: Not on file  ?Social Connections: Not on file  ?Intimate Partner Violence: Not on file  ? ? ?ROS ?Review of Systems  ?All other systems reviewed and are negative. ? ?Objective:  ? ?Today's Vitals: BP 127/86   Pulse (!) 109   Temp 98.8 ?F (37.1 ?C) (Oral)   Resp 16   Ht 5' 4.5" (1.638 m)   Wt 197 lb 6.4 oz (89.5 kg)   SpO2 95%   BMI 33.36 kg/m?  ? ?Physical Exam ?Vitals and nursing note reviewed.  ?Constitutional:   ?   General: She is not in acute distress. ?HENT:  ?   Head: Normocephalic and atraumatic.  ?   Right Ear: Tympanic membrane, ear canal and external ear normal.  ?   Left Ear: Tympanic membrane, ear canal and external  ear normal.  ?   Nose: Nose normal.  ?   Mouth/Throat:  ?   Mouth: Mucous membranes are moist.  ?   Pharynx: Oropharynx is clear.  ?Eyes:  ?   Conjunctiva/sclera: Conjunctivae normal.  ?   Pupils: Pupils are equal, round, and reactive to light.  ?Neck:  ?   Thyroid: No thyromegaly.  ?Cardiovascular:  ?   Rate and Rhythm: Normal rate and regular rhythm.  ?   Heart sounds: Normal heart sounds. No murmur heard. ?Pulmonary:  ?   Effort: Pulmonary effort is normal. No respiratory distress.  ?   Breath sounds: Normal breath sounds.  ?Abdominal:  ?   General: There is no distension.  ?   Palpations: Abdomen is soft. There is no mass.  ?   Tenderness: There is no abdominal tenderness.  ?Musculoskeletal:     ?   General: Normal range of motion.  ?   Cervical back: Normal range of motion and neck supple.  ?Skin: ?   General: Skin is warm and dry.  ?Neurological:  ?   General: No focal deficit present.  ?   Mental Status:  She is alert and oriented to person, place, and time.  ?Psychiatric:     ?   Mood and Affect: Mood normal.     ?   Behavior: Behavior normal.  ? ? ?Assessment & Plan:  ? ?1. Annual physical exam ?Routine labs ordered ?- CMP14+EGFR ? ?2. Screening for deficiency anemia ? ?- CBC with Differential ? ?3. Screening for HIV (human immunodeficiency virus) ? ?- HIV antibody (with reflex) ? ?4. Need for hepatitis C screening test ? ?- Hepatitis C Antibody ? ?5. Screening for endocrine/metabolic/immunity disorders ? ?- TSH ? ?6. Screening for lipid disorders ? ?- Lipid Panel ? ?7. Screening for diabetes mellitus (DM) ? ?- Hemoglobin A1c ? ? ? ?Outpatient Encounter Medications as of 11/19/2021  ?Medication Sig  ? Ascorbic Acid (VITAMIN C) 100 MG CHEW   ? celecoxib (CELEBREX) 200 MG capsule Take 1 capsule (200 mg total) by mouth daily.  ? FLUoxetine (PROZAC) 40 MG capsule Take 1 capsule (40 mg total) by mouth daily.  ? losartan (COZAAR) 50 MG tablet Take 1 tablet (50 mg total) by mouth daily.  ? minoxidil (MINOXIDIL FOR  WOMEN) 2 % external solution Apply topically 2 (two) times daily.  ? vitamin B-12 (CYANOCOBALAMIN) 100 MCG tablet   ? Zinc 10 MG LOZG   ? [DISCONTINUED] minoxidil (MINOXIDIL FOR WOMEN) 2 % external solution Apply topically 2 (two) times daily.  ? ?No facility-administered encounter medications on file as of 11/19/2021.  ? ? ?Follow-up: No follow-ups on file.  ? ?Becky Sax, MD ? ?

## 2021-12-08 IMAGING — US US EXTREM LOW VENOUS*R*
1 series · 14 of 24 positions shown · non-contrast
Comparison: None.

CLINICAL DATA: Right lower extremity swelling.

EXAM:
RIGHT LOWER EXTREMITY VENOUS DOPPLER ULTRASOUND
TECHNIQUE: Gray-scale sonography with compression, as well as color and duplex
ultrasound, were performed to evaluate the deep venous system(s)
from the level of the common femoral vein through the popliteal and
proximal calf veins.

[Series 1: us venous img lower uni right (dvt) · portal-venous · 14 of 35 slices shown]
[im 1/35]
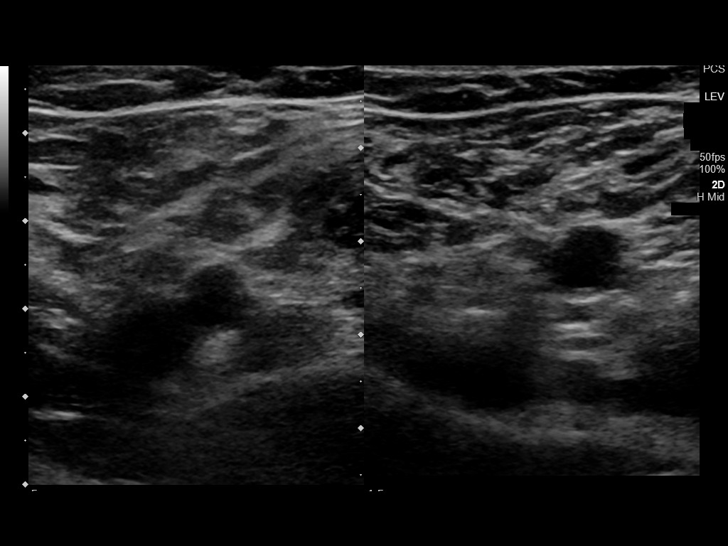
[im 3/35]
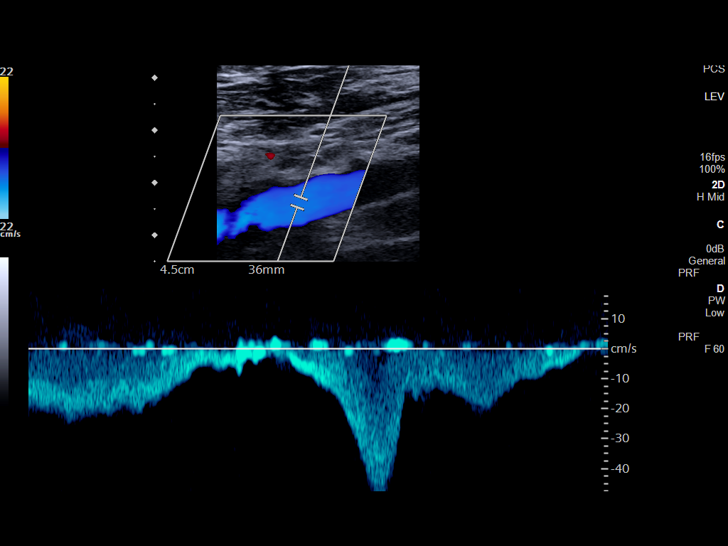
[im 6/35]
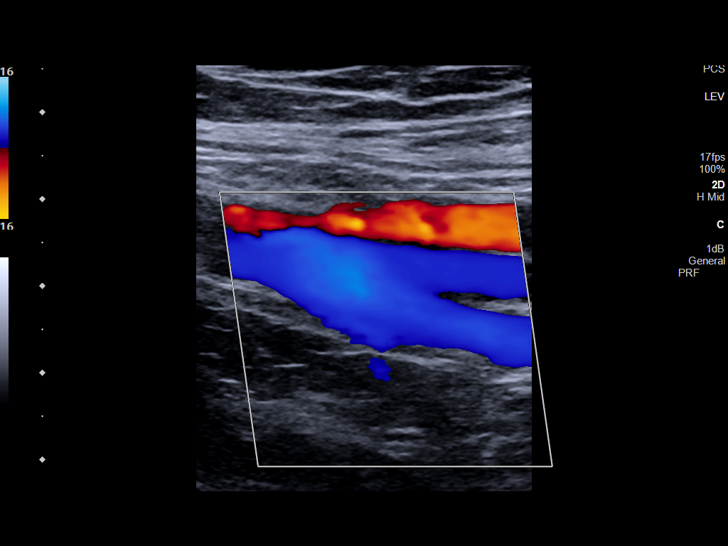
[im 9/35]
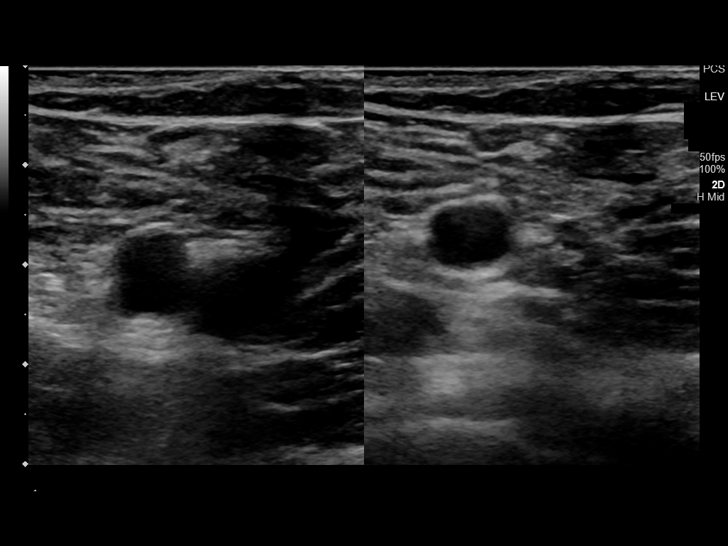
[im 11/35]
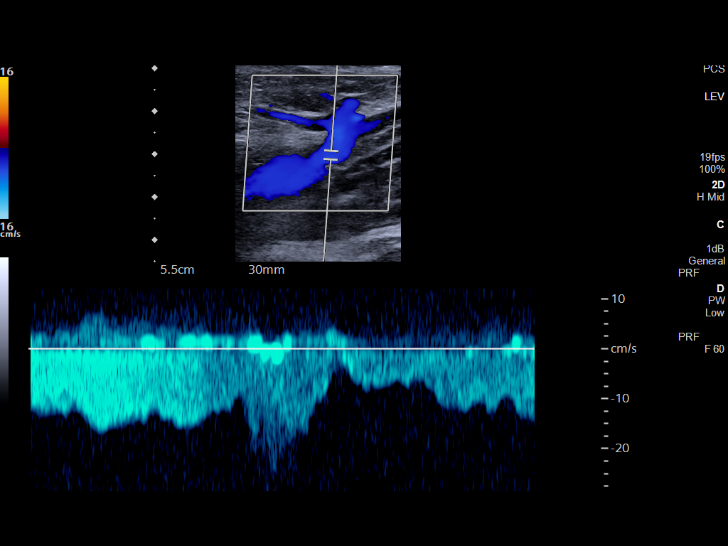
[im 14/35]
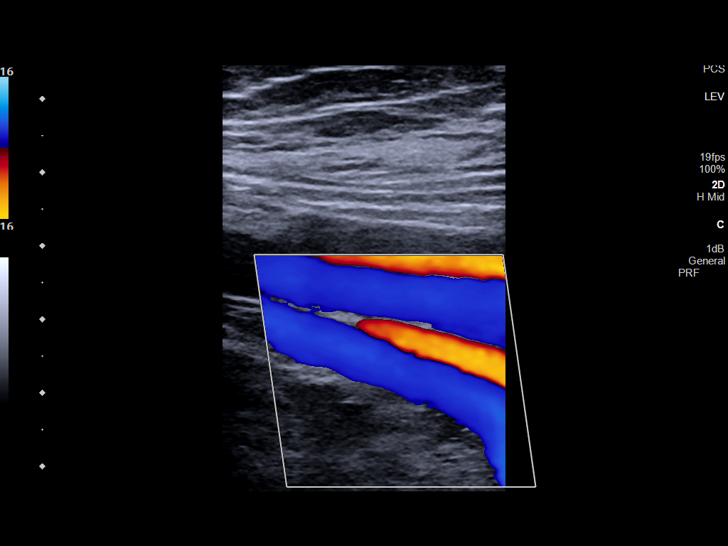
[im 17/35]
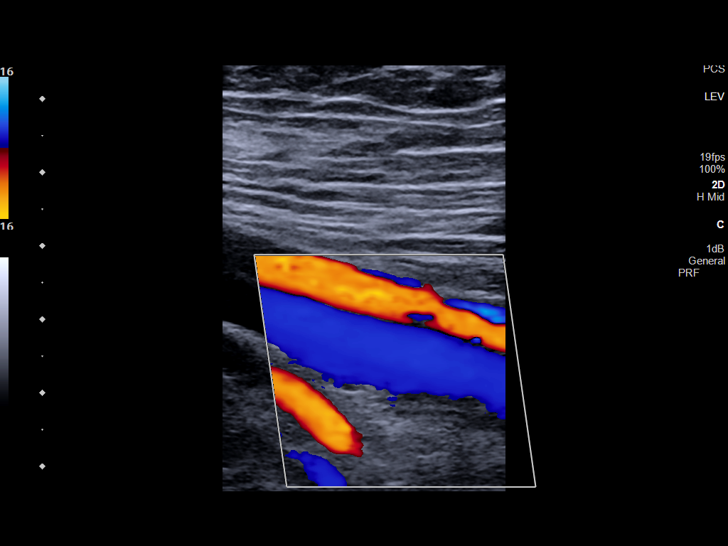
[im 18/35]
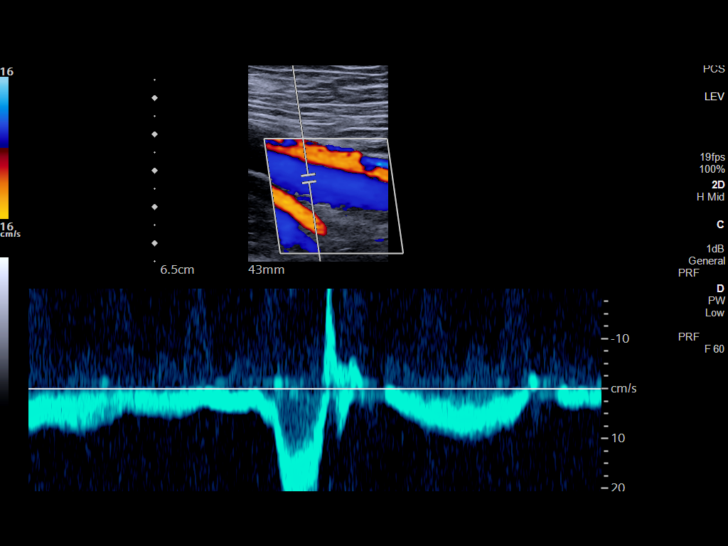
[im 21/35]
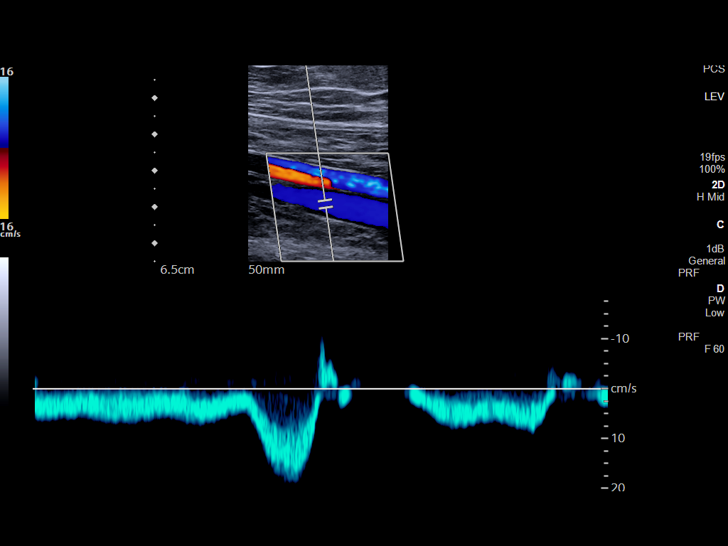
[im 24/35]
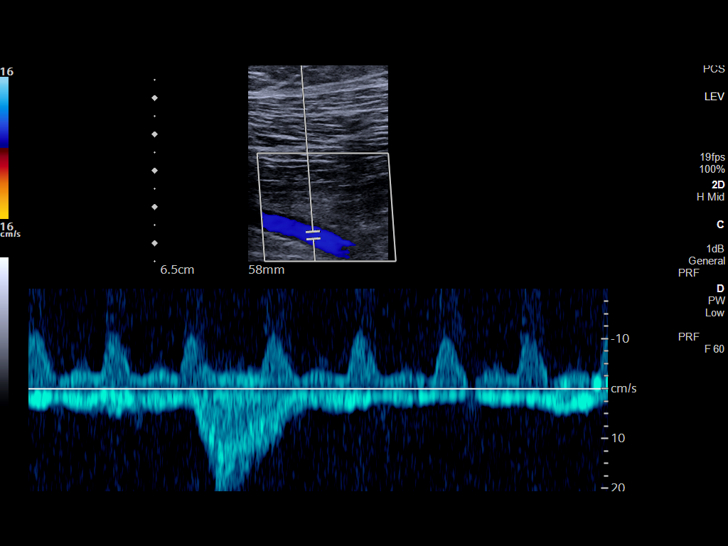
[im 27/35]
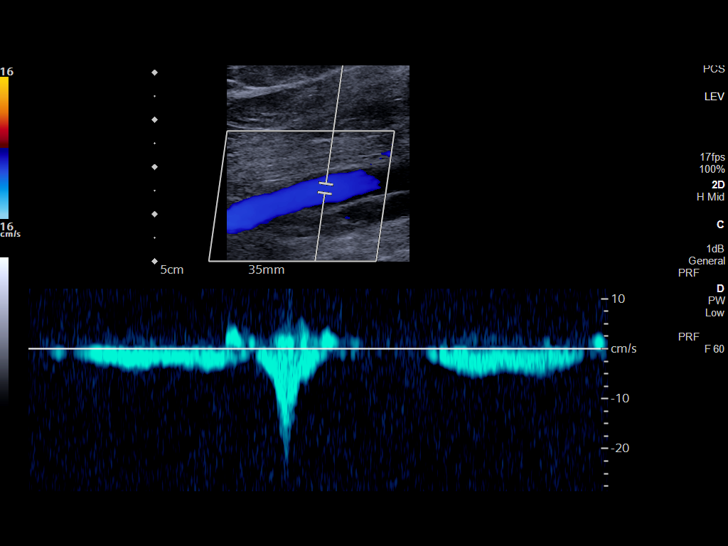
[im 29/35]
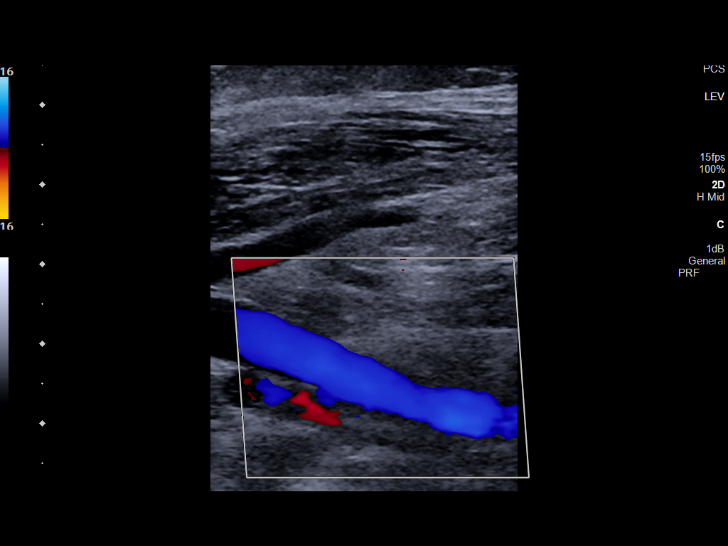
[im 32/35]
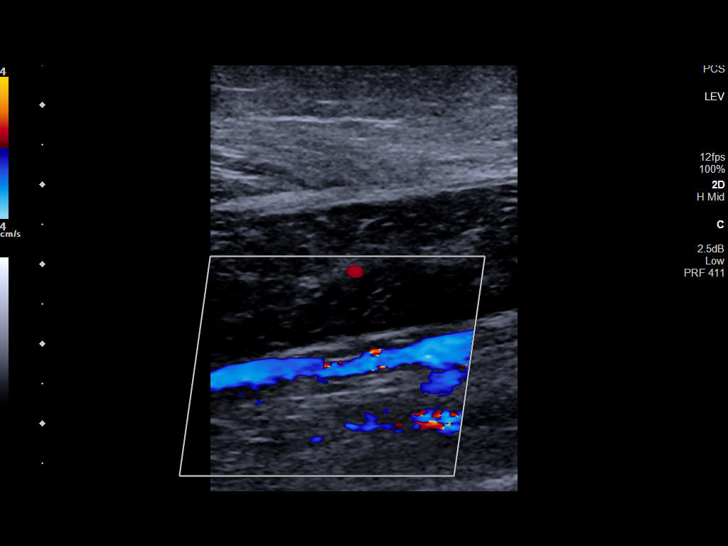
[im 35/35]
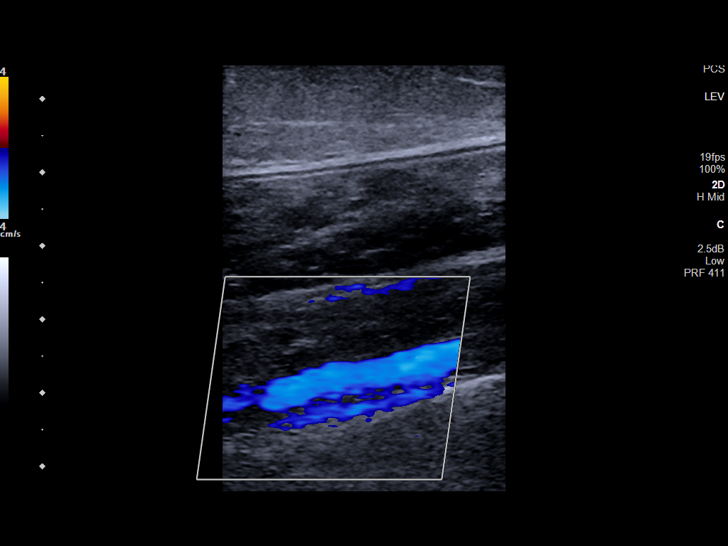

[14 of 24 positions shown; findings below may reference images not displayed]

FINDINGS: VENOUS

Normal compressibility of the common femoral, superficial femoral,
and popliteal veins, as well as the visualized calf veins.
Visualized portions of profunda femoral vein and great saphenous
vein unremarkable. No filling defects to suggest DVT on grayscale or
color Doppler imaging. Doppler waveforms show normal direction of
venous flow, normal respiratory plasticity and response to
augmentation.

Limited views of the contralateral common femoral vein are
unremarkable.
IMPRESSION: No evidence of DVT.

## 2022-03-24 ENCOUNTER — Telehealth: Payer: Self-pay | Admitting: Family Medicine

## 2022-03-24 NOTE — Telephone Encounter (Signed)
Pt dropping off paperwork to be filled out regarding physical and asking if Rubella Titer and TB test can be done asap? Pt asked for a call back to know when paperwork is ready for pick up. Please advise and thank you. Paperwork placed in provider bin.

## 2022-03-25 NOTE — Telephone Encounter (Signed)
I have attempted without success to contact this patient by phone to I left a message on answering machine.

## 2022-03-31 NOTE — Telephone Encounter (Signed)
Patient called to pick up school form Shelly Jones

## 2024-07-29 ENCOUNTER — Ambulatory Visit: Payer: Self-pay

## 2024-07-29 NOTE — Telephone Encounter (Signed)
 FYI Only or Action Required?: Action required by provider: medication refill request, update on patient condition, and Scheduled 08/05/24.  Patient was last seen in primary care on 11/19/2021 by Tanda Bleacher, MD.  Called Nurse Triage reporting Depression.  Symptoms began several months ago.  Interventions attempted: Nothing.  Symptoms are: gradually worsening.  Triage Disposition: See Physician Within 24 Hours  Patient/caregiver understands and will follow disposition?: Yes   Copied from CRM [id number]. Topic: Clinical - Red Word Triage >> Jul 29, 2024  4:09 PM Fonda T wrote: Red Word that prompted transfer to Nurse Triage: Pt calling states she is having increased depression with increased anxiety.  Reports she was taking medication, but has been without medication for about 1 year.  Would like to schedule appt as soon as possible to treat current symptoms.   Reports she had called in to schedule appt earlier and was advised by representative at time of call no appts were available until February.    Ph. [phone] Reason for Disposition  [1] Depression AND [2] getting worse (e.g., sleeping poorly, less able to do activities of daily living)  Answer Assessment - Initial Assessment Questions Pt called in stating her anxiety and depression are getting worse, states she spends most of her time in bed. States she is able to get up as she lives for her children. States she feels like a bad mom since she lost her insurance she wasn't able to continue coming to Dr. Tanda resulting in going off her medication. Discussed that she can always call in, provided her with 988 line or to call her support. She voiced appreciation. Discussed refill of Prozac  and Losartan , stated even if 30 day supply with no refills so she could begin to take medications so they could start working in her system. States Prozac  takes about 2 weeks for her to notice a difference. Discussed I would send request to  provider; pharmacy updated to [facility]. Pt scheduled with alternative provider within tier d/t appt availability for evaluation. Patient agrees with plan of care, and will call back if anything changes, or if symptoms worsen.     1. CONCERN: What happened that made you call today?     Pt called in stating she is wanting to re-establish care as she has not been able to come in for a year d/t loss of insurance. Pt is needing refill of Prozac  and losartan . She is agreeable to 30 day supply and will keep appt schedule 12/12.  2. DEPRESSION SYMPTOM SCREENING: How are you feeling overall? (e.g., decreased energy, increased sleeping or difficulty sleeping, difficulty concentrating, feelings of sadness, guilt, hopelessness, or worthlessness)     Decreased energy; feeling guilty and like a bad mom, states it is hard for her to get out of bed but she does for her children   3. SUPPORT: Who is with you now? Who do you live with? Do you have family or friends who you can talk to?      Pt with her children; good support with her brother   4. THERAPIST: Do you have a counselor or therapist? If Yes, ask: What is their name?     no  5. OTHER: Do you have any other physical symptoms right now? (e.g., fever)  None  Protocols used: Depression-A-AH

## 2024-08-01 NOTE — Telephone Encounter (Signed)
 Been over 2 years since last seen by Dr. Tanda. Pt can discuss medication refill at upcoming appointment with Folashade Paseda

## 2024-08-05 ENCOUNTER — Ambulatory Visit: Payer: Self-pay | Admitting: Nurse Practitioner

## 2024-08-05 ENCOUNTER — Encounter: Payer: Self-pay | Admitting: Nurse Practitioner

## 2024-08-05 VITALS — BP 148/89 | HR 95 | Ht 64.5 in | Wt 195.0 lb

## 2024-08-05 DIAGNOSIS — Z1231 Encounter for screening mammogram for malignant neoplasm of breast: Secondary | ICD-10-CM

## 2024-08-05 DIAGNOSIS — I10 Essential (primary) hypertension: Secondary | ICD-10-CM | POA: Insufficient documentation

## 2024-08-05 DIAGNOSIS — Z0000 Encounter for general adult medical examination without abnormal findings: Secondary | ICD-10-CM | POA: Diagnosis not present

## 2024-08-05 DIAGNOSIS — F332 Major depressive disorder, recurrent severe without psychotic features: Secondary | ICD-10-CM

## 2024-08-05 DIAGNOSIS — F411 Generalized anxiety disorder: Secondary | ICD-10-CM | POA: Diagnosis not present

## 2024-08-05 DIAGNOSIS — F339 Major depressive disorder, recurrent, unspecified: Secondary | ICD-10-CM | POA: Insufficient documentation

## 2024-08-05 MED ORDER — FLUOXETINE HCL 20 MG PO CAPS: 20.0000 mg | ORAL_CAPSULE | Freq: Every day | ORAL | 3 refills | Status: AC

## 2024-08-05 MED ORDER — LOSARTAN POTASSIUM 50 MG PO TABS: 50.0000 mg | ORAL_TABLET | Freq: Every day | ORAL | 1 refills | Status: AC

## 2024-08-05 NOTE — Assessment & Plan Note (Signed)
°  Due for routine screenings. Mammogram and Pap smear are overdue. - Ordered mammogram. - Arranged Pap smear with Doctor Tanda.

## 2024-08-05 NOTE — Assessment & Plan Note (Signed)
 BP Readings from Last 3 Encounters:  08/05/24 (!) 148/89  11/19/21 127/86  11/01/21 (!) 139/94   Hypertension previously managed with losartan . - Restart losartan  50 mg daily. - Check blood pressure at home three times a week and record readings. - Follow up with Doctor Wilson's office in 4-6 weeks to assess blood pressure control. - Encouraged a heart-healthy, low salt, low fat diet. - Encouraged moderate exercise for 30 minutes, five days a week as tolerated.

## 2024-08-05 NOTE — Assessment & Plan Note (Signed)
°    08/05/2024    8:08 AM 11/19/2021    1:42 PM  GAD 7 : Generalized Anxiety Score  Nervous, Anxious, on Edge 3 1  Control/stop worrying 2 0  Worry too much - different things 2 1  Trouble relaxing 3 3  Restless 0 0  Easily annoyed or irritable 2 1  Afraid - awful might happen 0 0  Total GAD 7 Score 12 6  Anxiety Difficulty Extremely difficult    Depression and anxiety Previously managed with Prozac , off medication due to lack of insurance. Prefers medication over therapy. - Restart Prozac  at a lower dose and titrate to 40 mg as tolerated. - Discussed therapy and counseling as additional options for managing depression and anxiety.

## 2024-08-05 NOTE — Assessment & Plan Note (Addendum)
°    08/05/2024    8:09 AM 11/19/2021    1:41 PM 11/01/2021   10:23 AM  Depression screen PHQ 2/9  Decreased Interest 3 3 3   Down, Depressed, Hopeless 3 0 3  PHQ - 2 Score 6 3 6   Altered sleeping 3 3 3   Tired, decreased energy 3 3 3   Change in appetite 3 2 3   Feeling bad or failure about yourself  2 1 3   Trouble concentrating 3 2 3   Moving slowly or fidgety/restless 0 0 0  Suicidal thoughts 0 0 1  PHQ-9 Score 20 14  22    Difficult doing work/chores Extremely dIfficult Somewhat difficult Very difficult     Data saved with a previous flowsheet row definition   Depression and anxiety Previously managed with Prozac , off medication due to lack of insurance. Prefers medication over therapy. - Restart Prozac  at a lower dose and titrate to 40 mg as tolerated. - Discussed therapy and counseling as additional options for managing depression and anxiety.

## 2024-08-05 NOTE — Progress Notes (Signed)
 Acute Office Visit  Subjective:     Patient ID: Shelly Jones, female    DOB: 1980-12-13, 43 y.o.   MRN: [id number]  Chief Complaint  Patient presents with   Anxiety   Depression    HPI   Discussed the use of AI scribe software for clinical note transcription with the patient, who gave verbal consent to proceed.  History of Present Illness Shelly Jones is a 43 year old female who presents to restart her medications for hypertension and depression.  She has been off her medications for hypertension and depression for at least a year due to lack of insurance. She is seeking to restart her medications, which include losartan  50 mg daily for high blood pressure and Prozac  for anxiety and depression.  She has a blood pressure machine at home. She has not had any recent blood work since 2023.  No chest pain, shortness of breath, or swelling in her legs. She denies SI, HI  She has received her flu shot.    Assessment & Plan     Review of Systems  Constitutional:  Negative for appetite change, chills, fatigue and fever.  HENT:  Negative for congestion, postnasal drip, rhinorrhea and sneezing.   Respiratory:  Negative for cough, shortness of breath and wheezing.   Cardiovascular:  Negative for chest pain, palpitations and leg swelling.  Gastrointestinal:  Negative for abdominal pain, constipation, nausea and vomiting.  Genitourinary:  Negative for difficulty urinating, dysuria, flank pain and frequency.  Musculoskeletal:  Negative for arthralgias, back pain, joint swelling and myalgias.  Skin:  Negative for color change, pallor, rash and wound.  Neurological:  Negative for dizziness, facial asymmetry, weakness, numbness and headaches.  Psychiatric/Behavioral:  Negative for behavioral problems, confusion, self-injury and suicidal ideas.         Objective:    BP (!) 148/89   Pulse 95   Ht 5' 4.5 (1.638 m)   Wt 195 lb (88.5 kg)   SpO2 100%   BMI 32.95 kg/m     Physical Exam Vitals and nursing note reviewed.  Constitutional:      General: She is not in acute distress.    Appearance: Normal appearance. She is obese. She is not ill-appearing, toxic-appearing or diaphoretic.  Eyes:     General: No scleral icterus.       Right eye: No discharge.        Left eye: No discharge.     Extraocular Movements: Extraocular movements intact.     Conjunctiva/sclera: Conjunctivae normal.  Cardiovascular:     Rate and Rhythm: Normal rate and regular rhythm.     Pulses: Normal pulses.     Heart sounds: Normal heart sounds. No murmur heard.    No friction rub. No gallop.  Pulmonary:     Effort: Pulmonary effort is normal. No respiratory distress.     Breath sounds: Normal breath sounds. No stridor. No wheezing, rhonchi or rales.  Chest:     Chest wall: No tenderness.  Abdominal:     General: There is no distension.     Palpations: Abdomen is soft.     Tenderness: There is no abdominal tenderness. There is no right CVA tenderness, left CVA tenderness or guarding.  Musculoskeletal:        General: No swelling, tenderness, deformity or signs of injury.     Right lower leg: No edema.     Left lower leg: No edema.  Skin:    General: Skin  is warm and dry.     Capillary Refill: Capillary refill takes less than 2 seconds.     Coloration: Skin is not jaundiced or pale.     Findings: No bruising, erythema or lesion.  Neurological:     Mental Status: She is alert and oriented to person, place, and time.     Motor: No weakness.     Coordination: Coordination normal.     Gait: Gait normal.  Psychiatric:        Mood and Affect: Mood normal.        Behavior: Behavior normal.        Thought Content: Thought content normal.        Judgment: Judgment normal.     No results found for any visits on 08/05/24.      Assessment & Plan:   Problem List Items Addressed This Visit       Cardiovascular and Mediastinum   Hypertension - Primary   BP Readings  from Last 3 Encounters:  08/05/24 (!) 148/89  11/19/21 127/86  11/01/21 (!) 139/94   Hypertension previously managed with losartan . - Restart losartan  50 mg daily. - Check blood pressure at home three times a week and record readings. - Follow up with Doctor Wilson's office in 4-6 weeks to assess blood pressure control. - Encouraged a heart-healthy, low salt, low fat diet. - Encouraged moderate exercise for 30 minutes, five days a week as tolerated.        Relevant Medications   losartan  (COZAAR ) 50 MG tablet   Other Relevant Orders   CMP14+EGFR     Other   Major depression, recurrent      08/05/2024    8:09 AM 11/19/2021    1:41 PM 11/01/2021   10:23 AM  Depression screen PHQ 2/9  Decreased Interest 3 3 3   Down, Depressed, Hopeless 3 0 3  PHQ - 2 Score 6 3 6   Altered sleeping 3 3 3   Tired, decreased energy 3 3 3   Change in appetite 3 2 3   Feeling bad or failure about yourself  2 1 3   Trouble concentrating 3 2 3   Moving slowly or fidgety/restless 0 0 0  Suicidal thoughts 0 0 1  PHQ-9 Score 20 14  22    Difficult doing work/chores Extremely dIfficult Somewhat difficult Very difficult     Data saved with a previous flowsheet row definition   Depression and anxiety Previously managed with Prozac , off medication due to lack of insurance. Prefers medication over therapy. - Restart Prozac  at a lower dose and titrate to 40 mg as tolerated. - Discussed therapy and counseling as additional options for managing depression and anxiety.        Relevant Medications   FLUoxetine  (PROZAC ) 20 MG capsule   GAD (generalized anxiety disorder)      08/05/2024    8:08 AM 11/19/2021    1:42 PM  GAD 7 : Generalized Anxiety Score  Nervous, Anxious, on Edge 3 1  Control/stop worrying 2 0  Worry too much - different things 2 1  Trouble relaxing 3 3  Restless 0 0  Easily annoyed or irritable 2 1  Afraid - awful might happen 0 0  Total GAD 7 Score 12 6  Anxiety Difficulty Extremely  difficult    Depression and anxiety Previously managed with Prozac , off medication due to lack of insurance. Prefers medication over therapy. - Restart Prozac  at a lower dose and titrate to 40 mg as tolerated. - Discussed therapy  and counseling as additional options for managing depression and anxiety.        Relevant Medications   FLUoxetine  (PROZAC ) 20 MG capsule   Health care maintenance    Due for routine screenings. Mammogram and Pap smear are overdue. - Ordered mammogram. - Arranged Pap smear with Doctor Tanda.      Other Visit Diagnoses       Screening mammogram for breast cancer       Relevant Orders   MM 3D SCREENING MAMMOGRAM BILATERAL BREAST       Meds ordered this encounter  Medications   losartan  (COZAAR ) 50 MG tablet    Sig: Take 1 tablet (50 mg total) by mouth daily.    Dispense:  60 tablet    Refill:  1   FLUoxetine  (PROZAC ) 20 MG capsule    Sig: Take 1 capsule (20 mg total) by mouth daily.    Dispense:  90 capsule    Refill:  3    Return in about 4 weeks (around 09/02/2024) for HTN, ANXIETY, DEPRESSION.  Ewin Rehberg R Ellis Koffler, FNP

## 2024-08-05 NOTE — Patient Instructions (Addendum)
 1. Primary hypertension (Primary) - losartan  (COZAAR ) 50 MG tablet; Take 1 tablet (50 mg total) by mouth daily.  Dispense: 60 tablet; Refill: 1 - CMP14+EGFR  2. Severe episode of recurrent major depressive disorder, without psychotic features (HCC) - FLUoxetine  (PROZAC ) 20 MG capsule; Take 1 capsule (20 mg total) by mouth daily.  Dispense: 90 capsule; Refill: 3  3. GAD (generalized anxiety disorder) - FLUoxetine  (PROZAC ) 20 MG capsule; Take 1 capsule (20 mg total) by mouth daily.  Dispense: 90 capsule; Refill: 3   Please call [phone]   to schedule your mammogram.  [facility]. [address] [place], [place] [address]. [place] .    Around 3 times per week, check your blood pressure 2 times per day. once in the morning and once in the evening. The readings should be at least one minute apart. Write down these values and bring them to your next nurse visit/appointment.  When you check your BP, make sure you have been doing something calm/relaxing 5 minutes prior to checking. Both feet should be flat on the floor and you should be sitting. Use your left arm and make sure it is in a relaxed position (on a table), and that the cuff is at the approximate level/height of your heart.    It is important that you exercise regularly at least 30 minutes 5 times a week as tolerated  Think about what you will eat, plan ahead. Choose  clean, green, fresh or frozen over canned, processed or packaged foods which are more sugary, salty and fatty. 70 to 75% of food eaten should be vegetables and fruit. Three meals at set times with snacks allowed between meals, but they must be fruit or vegetables. Aim to eat over a 12 hour period , example 7 am to 7 pm, and STOP after  your last meal of the day. Drink water,generally about 64 ounces per day, no other drink is as healthy. Fruit juice is best enjoyed in a healthy way, by EATING the fruit.  Thanks for choosing  [facility] we consider it a privelige to serve you.

## 2024-08-06 LAB — CMP14+EGFR
ALT: 14 IU/L (ref 0–32)
AST: 18 IU/L (ref 0–40)
Albumin: 4.8 g/dL (ref 3.9–4.9)
Alkaline Phosphatase: 84 IU/L (ref 41–116)
BUN/Creatinine Ratio: 9 (ref 9–23)
BUN: 8 mg/dL (ref 6–24)
Bilirubin Total: 0.3 mg/dL (ref 0.0–1.2)
CO2: 20 mmol/L (ref 20–29)
Calcium: 9.9 mg/dL (ref 8.7–10.2)
Chloride: 100 mmol/L (ref 96–106)
Creatinine, Ser: 0.88 mg/dL (ref 0.57–1.00)
Globulin, Total: 2.5 g/dL (ref 1.5–4.5)
Glucose: 90 mg/dL (ref 70–99)
Potassium: 4.6 mmol/L (ref 3.5–5.2)
Sodium: 138 mmol/L (ref 134–144)
Total Protein: 7.3 g/dL (ref 6.0–8.5)
eGFR: 84 mL/min/1.73 (ref >59)

## 2024-08-08 ENCOUNTER — Ambulatory Visit: Payer: Self-pay | Admitting: Nurse Practitioner
